# Patient Record
Sex: Female | Born: 1955 | Race: White | Hispanic: No | Marital: Single | Smoking: Current every day smoker
Health system: Southern US, Community
[De-identification: ages and names within clinical notes are randomized; demographics above are authoritative.]

## PROBLEM LIST (undated history)

## (undated) DIAGNOSIS — M549 Dorsalgia, unspecified: Secondary | ICD-10-CM

## (undated) DIAGNOSIS — M1990 Unspecified osteoarthritis, unspecified site: Secondary | ICD-10-CM

## (undated) DIAGNOSIS — I10 Essential (primary) hypertension: Secondary | ICD-10-CM

## (undated) HISTORY — PX: DILATION AND CURETTAGE OF UTERUS: SHX78

## (undated) HISTORY — PX: TONSILLECTOMY: SUR1361

## (undated) HISTORY — PX: APPENDECTOMY: SHX54

---

## 2003-03-13 HISTORY — PX: CARPAL TUNNEL RELEASE: SHX101

## 2003-12-11 ENCOUNTER — Emergency Department (HOSPITAL_COMMUNITY): Admission: EM | Admit: 2003-12-11 | Discharge: 2003-12-12 | Payer: Self-pay | Admitting: Emergency Medicine

## 2004-05-31 ENCOUNTER — Emergency Department: Payer: Self-pay | Admitting: General Practice

## 2004-08-11 ENCOUNTER — Ambulatory Visit: Payer: Self-pay | Admitting: Orthopedic Surgery

## 2005-02-28 ENCOUNTER — Emergency Department: Payer: Self-pay | Admitting: Internal Medicine

## 2005-06-28 ENCOUNTER — Ambulatory Visit: Payer: Self-pay | Admitting: General Surgery

## 2005-09-20 ENCOUNTER — Other Ambulatory Visit: Payer: Self-pay

## 2005-09-20 ENCOUNTER — Inpatient Hospital Stay: Payer: Self-pay | Admitting: Internal Medicine

## 2006-03-12 HISTORY — PX: BACK SURGERY: SHX140

## 2007-02-21 ENCOUNTER — Ambulatory Visit (HOSPITAL_COMMUNITY): Admission: RE | Admit: 2007-02-21 | Discharge: 2007-02-21 | Payer: Self-pay | Admitting: Neurosurgery

## 2007-03-21 ENCOUNTER — Inpatient Hospital Stay (HOSPITAL_COMMUNITY): Admission: RE | Admit: 2007-03-21 | Discharge: 2007-03-23 | Payer: Self-pay | Admitting: Neurosurgery

## 2010-07-25 NOTE — Op Note (Signed)
NAMEKEHINDE, TOTZKE NO.:  [id number]   MEDICAL RECORD NO.:  [id number]          PATIENT TYPE:  INP   LOCATION:  3172                         FACILITY:  [facility]   PHYSICIAN:  Hilda Lias, M.D.   DATE OF BIRTH:  02-08-1956   DATE OF PROCEDURE:  03/21/2007  DATE OF DISCHARGE:                               OPERATIVE REPORT   PREOPERATIVE DIAGNOSIS:  Right L3-L4 herniated disk, intra and  extraforaminal right L4-5 herniated disk, multiple fractures of the  lumbar spine.   POSTOPERATIVE DIAGNOSES:  Right L3-L4 herniated disk, intra and  extraforaminal right L4-5 herniated disk, multiple fractures of the  lumbar spine.   PROCEDURE:  Right L4 hemilaminectomy, right L3-L4 diskectomy, right 4-5  foraminotomy.  Microscope.   SURGEON:  Hilda Lias, M.D.   ASSISTANT:  Hewitt Shorts, M.D.   INDICATIONS FOR PROCEDURE:  The patient was seen by me on several  occasions because of back pain __________  .  The patient has failed  conservative treatment.  X-rays showed that she has a herniated disk at  the level of 3-5, intra and extraforaminal process of 4/5 at the level  of the right side.  Surgery was advised.  The risks were explained to  her in the office.   PROCEDURE:  The patient was taken to the OR and she was positioned in a  prone manner.  The back was cleaned with DuraPrep.  Drapes were applied.   Midline incision from L3 to L4-5 was made.  X-rays showed that indeed  the __________  were at the spinal process of 4 and 5.  Then we brought  the microscope into the area.  We identified 3-4 and 4-5.  The patient  has quite a bit of hypertrophy of the facet.  With the microscope  __________  , we went ahead and we drilled the lower lamina of L4 and  the upper of L5.  Nevertheless, the patient was quite narrow and because  of that we had to proceed with a L4 hemilaminectomy.  The thick yellow  ligament also plus removal of one-third of the facet was done.   We  identified the L4-5 space and although there was a bulging disk, once we  did the foraminotomy, indeed the disk was quite hard and there was no  soft component.  __________  space was made and we decided against doing  the diskectomy.  Then we were at the level of 3-4 and indeed the disk  was soft, bulging, and going slightly into L4.  Incision was made, and a  large amount of degenerative disk with two large fragments lateral were  removed.  At the end, we had good decompression of not only the L4 but  also the L3 nerve root.  Because of that, we decided against doing  extraforaminal approach.  The area was irrigated.  Fentanyl and Depo-  Medrol were left in the epidural space, and the wound was closed with  Vicryl and Steri-Strips.           ______________________________  Hilda Lias, M.D.  EB/MEDQ  D:  03/21/2007  T:  03/21/2007  Job:  [id number]

## 2010-07-25 NOTE — H&P (Signed)
NAMEQUANNA, WITTKE NO.:  [id number]   MEDICAL RECORD NO.:  [id number]          PATIENT TYPE:  INP   LOCATION:  NA                           FACILITY:  [facility]   PHYSICIAN:  Hilda Lias, M.D.   DATE OF BIRTH:  Sep 01, 1955   DATE OF ADMISSION:  03/20/2006  DATE OF DISCHARGE:                              HISTORY & PHYSICAL   Michele Burton is a lady who was seen by me back in July 2008 complaining of  back pain radiation to the right leg, and she was quite miserable.  The  patient told me that the pain was getting worse.  She denies any pain to  the left leg.  She has had had MRI and follow-up.  Because of no  improvement she had a myelogram, and because of that she is being  admitted for surgery.   PAST HISTORY:  Gastrointestinal surgery.  She had a carpal tunnel surgery.   ALLERGIES:  She is allergic to VICODIN.   SOCIAL HISTORY:  She drinks socially.  She smokes a pack a day.   FAMILY HISTORY:  She is adopted.   REVIEW OF SYSTEMS:  Positive for liver disease, back pain, neck pain.   PHYSICAL EXAMINATION:  GENERAL:  The patient came to my office with two  friends.  She has difficulty walking.  HEENT:  Normal.  NECK:  Normal.  LUNGS:  Clear.  HEART:  Heart sounds normal.  ABDOMEN:  Normal.  EXTREMITIES:  Normal pulses.  Marland Kitchen  NEUROLOGICAL:  Showed that she has weakness of the right quadriceps and  iliopsoas.  She had mild weakness of the right dorsiflexion.  The  myelogram showed that indeed she has a herniated disk at the level of 3-  4 compromising the L4 nerve root.  At the level of 4-5 she has intra and  extraforaminal herniated disk.   CLINICAL IMPRESSION:  Right L3-L4, L4-L5 herniated disk.   RECOMMENDATIONS:  The patient is being admitted for surgery.  The  procedure will be a 3-4, 4-5 diskectomy approach in the 4-5  extraforaminal.  She knows about the risks of infection, CSF leak,  worsening pain, paralysis,  need for further surgery.    ______________________________  Hilda Lias, M.D.    EB/MEDQ  D:  03/21/2007  T:  03/21/2007  Job:  [id number]

## 2010-11-29 LAB — CBC
HCT: 41.2
Hemoglobin: 13.9
MCHC: 33.7
MCV: 99.1
Platelets: 167
RBC: 4.16
RDW: 14.7
WBC: 7.2

## 2010-11-29 LAB — HEPATIC FUNCTION PANEL
ALT: 87 — ABNORMAL HIGH
AST: 99 — ABNORMAL HIGH
Albumin: 4.1
Alkaline Phosphatase: 62
Bilirubin, Direct: 0.2
Indirect Bilirubin: 0.3
Total Bilirubin: 0.5
Total Protein: 7.9

## 2011-03-13 HISTORY — PX: EYE SURGERY: SHX253

## 2011-11-13 ENCOUNTER — Ambulatory Visit: Payer: Self-pay | Admitting: Ophthalmology

## 2012-08-27 ENCOUNTER — Other Ambulatory Visit: Payer: Self-pay | Admitting: Neurosurgery

## 2012-08-27 DIAGNOSIS — IMO0002 Reserved for concepts with insufficient information to code with codable children: Secondary | ICD-10-CM

## 2012-09-03 ENCOUNTER — Ambulatory Visit
Admission: RE | Admit: 2012-09-03 | Discharge: 2012-09-03 | Disposition: A | Discharge status: Home / Self Care | Payer: Self-pay | Source: Ambulatory Visit | Attending: Neurosurgery | Admitting: Neurosurgery

## 2012-09-03 ENCOUNTER — Ambulatory Visit
Admission: RE | Admit: 2012-09-03 | Discharge: 2012-09-03 | Disposition: A | Discharge status: Home / Self Care | Payer: No Typology Code available for payment source | Source: Ambulatory Visit | Attending: Neurosurgery | Admitting: Neurosurgery

## 2012-09-03 VITALS — BP 135/83 | HR 75 | Ht 65.0 in | Wt 130.0 lb

## 2012-09-03 DIAGNOSIS — IMO0002 Reserved for concepts with insufficient information to code with codable children: Secondary | ICD-10-CM

## 2012-09-03 MED ORDER — MEPERIDINE HCL 100 MG/ML IJ SOLN
75.0000 mg | Freq: Once | INTRAMUSCULAR | Status: AC
  Administered 2012-09-03: 75 mg via INTRAMUSCULAR

## 2012-09-03 MED ORDER — IOHEXOL 180 MG/ML  SOLN
15.0000 mL | Freq: Once | INTRAMUSCULAR | Status: AC | PRN
  Administered 2012-09-03: 15 mL via INTRATHECAL

## 2012-09-03 MED ORDER — DIAZEPAM 5 MG PO TABS
10.0000 mg | ORAL_TABLET | Freq: Once | ORAL | Status: AC
  Administered 2012-09-03: 10 mg via ORAL

## 2012-09-03 MED ORDER — ONDANSETRON HCL 4 MG/2ML IJ SOLN
4.0000 mg | Freq: Once | INTRAMUSCULAR | Status: AC
  Administered 2012-09-03: 4 mg via INTRAMUSCULAR

## 2012-09-26 ENCOUNTER — Other Ambulatory Visit: Payer: Self-pay | Admitting: Neurosurgery

## 2012-09-29 ENCOUNTER — Encounter (HOSPITAL_COMMUNITY): Payer: Self-pay | Admitting: Pharmacy Technician

## 2012-10-03 ENCOUNTER — Encounter (HOSPITAL_COMMUNITY): Payer: Self-pay

## 2012-10-03 ENCOUNTER — Encounter (HOSPITAL_COMMUNITY)
Admission: RE | Admit: 2012-10-03 | Discharge: 2012-10-03 | Disposition: A | Discharge status: Home / Self Care | Payer: Self-pay | Source: Ambulatory Visit | Attending: Neurosurgery | Admitting: Neurosurgery

## 2012-10-03 DIAGNOSIS — M5126 Other intervertebral disc displacement, lumbar region: Secondary | ICD-10-CM | POA: Insufficient documentation

## 2012-10-03 DIAGNOSIS — Z01812 Encounter for preprocedural laboratory examination: Secondary | ICD-10-CM | POA: Insufficient documentation

## 2012-10-03 HISTORY — DX: Unspecified osteoarthritis, unspecified site: M19.90

## 2012-10-03 LAB — CBC
HCT: 37.5 % (ref 36.0–46.0)
Hemoglobin: 13 g/dL (ref 12.0–15.0)
MCH: 35.3 pg — ABNORMAL HIGH (ref 26.0–34.0)
MCV: 101.9 fL — ABNORMAL HIGH (ref 78.0–100.0)
RBC: 3.68 MIL/uL — ABNORMAL LOW (ref 3.87–5.11)
WBC: 4 10*3/uL (ref 4.0–10.5)

## 2012-10-03 LAB — SURGICAL PCR SCREEN
MRSA, PCR: NEGATIVE
Staphylococcus aureus: NEGATIVE

## 2012-10-03 NOTE — Pre-Procedure Instructions (Addendum)
Sean Macwilliams  10/03/2012   Your procedure is scheduled on:  October 07, 2012  Report to [facility] at 1015 AM.  Call this number if you have problems the morning of surgery: [phone]   Remember:   Do not eat food or drink liquids after midnight.   Take these medicines the morning of surgery with A SIP OF WATER: valium, hydrocodone   Do not wear jewelry, make-up or nail polish.  Do not wear lotions, powders, or perfumes. You may wear deodorant.  Do not shave 48 hours prior to surgery. Men may shave face and neck.  Do not bring valuables to the hospital.  [facility] is not responsible for any belongings or valuables.  Contacts, dentures or bridgework may not be worn into surgery.  Leave suitcase in the car. After surgery it may be brought to your room.  For patients admitted to the hospital, checkout time is 11:00 AM the day of  discharge.   Patients discharged the day of surgery will not be allowed to drive  home.  Name and phone number of your driver:   Special Instructions: Shower using CHG 2 nights before surgery and the night before surgery.  If you shower the day of surgery use CHG.  Use special wash - you have one bottle of CHG for all showers.  You should use approximately 1/3 of the bottle for each shower.   Please read over the following fact sheets that you were given: Pain Booklet, Coughing and Deep Breathing, MRSA Information and Surgical Site Infection Prevention

## 2012-10-06 MED ORDER — CEFAZOLIN SODIUM-DEXTROSE 2-3 GM-% IV SOLR
2.0000 g | INTRAVENOUS | Status: AC
  Administered 2012-10-07: 2 g via INTRAVENOUS
  Filled 2012-10-06: qty 50

## 2012-10-07 ENCOUNTER — Encounter (HOSPITAL_COMMUNITY): Payer: Self-pay | Admitting: Anesthesiology

## 2012-10-07 ENCOUNTER — Ambulatory Visit (HOSPITAL_COMMUNITY): Payer: Self-pay | Admitting: Anesthesiology

## 2012-10-07 ENCOUNTER — Inpatient Hospital Stay (HOSPITAL_COMMUNITY)
Admission: RE | Admit: 2012-10-07 | Discharge: 2012-10-08 | DRG: 491 | Disposition: A | Discharge status: Home / Self Care | Payer: MEDICAID | Source: Ambulatory Visit | Attending: Neurosurgery | Admitting: Neurosurgery

## 2012-10-07 ENCOUNTER — Ambulatory Visit (HOSPITAL_COMMUNITY): Payer: Self-pay

## 2012-10-07 ENCOUNTER — Encounter (HOSPITAL_COMMUNITY): Payer: Self-pay | Admitting: *Deleted

## 2012-10-07 ENCOUNTER — Encounter (HOSPITAL_COMMUNITY)
Admission: RE | Disposition: A | Discharge status: Home / Self Care | Payer: Self-pay | Source: Ambulatory Visit | Attending: Neurosurgery

## 2012-10-07 DIAGNOSIS — M5126 Other intervertebral disc displacement, lumbar region: Principal | ICD-10-CM | POA: Diagnosis present

## 2012-10-07 DIAGNOSIS — F17200 Nicotine dependence, unspecified, uncomplicated: Secondary | ICD-10-CM | POA: Diagnosis present

## 2012-10-07 HISTORY — PX: LUMBAR LAMINECTOMY/DECOMPRESSION MICRODISCECTOMY: SHX5026

## 2012-10-07 SURGERY — LUMBAR LAMINECTOMY/DECOMPRESSION MICRODISCECTOMY 1 LEVEL
Anesthesia: General | Laterality: Right | Wound class: Clean

## 2012-10-07 MED ORDER — ALBUMIN HUMAN 5 % IV SOLN
INTRAVENOUS | Status: DC | PRN
  Administered 2012-10-07: 15:00:00 via INTRAVENOUS

## 2012-10-07 MED ORDER — DIAZEPAM 5 MG PO TABS
5.0000 mg | ORAL_TABLET | Freq: Four times a day (QID) | ORAL | Status: DC | PRN
  Administered 2012-10-07 – 2012-10-08 (×2): 5 mg via ORAL
  Filled 2012-10-07: qty 1

## 2012-10-07 MED ORDER — LIDOCAINE HCL 4 % MT SOLN
OROMUCOSAL | Status: DC | PRN
  Administered 2012-10-07: 4 mL via TOPICAL

## 2012-10-07 MED ORDER — MORPHINE SULFATE (PF) 1 MG/ML IV SOLN
INTRAVENOUS | Status: DC
  Administered 2012-10-07 (×2): via INTRAVENOUS
  Administered 2012-10-07: 10.5 mg via INTRAVENOUS
  Administered 2012-10-08: 13.5 mg via INTRAVENOUS
  Administered 2012-10-08: 6.5 mg via INTRAVENOUS
  Administered 2012-10-08: 09:00:00 via INTRAVENOUS
  Filled 2012-10-07 (×2): qty 25

## 2012-10-07 MED ORDER — CEFAZOLIN SODIUM 1-5 GM-% IV SOLN
1.0000 g | Freq: Three times a day (TID) | INTRAVENOUS | Status: AC
  Administered 2012-10-07 – 2012-10-08 (×2): 1 g via INTRAVENOUS
  Filled 2012-10-07 (×2): qty 50

## 2012-10-07 MED ORDER — ONDANSETRON HCL 4 MG/2ML IJ SOLN
INTRAMUSCULAR | Status: DC | PRN
  Administered 2012-10-07: 4 mg via INTRAVENOUS

## 2012-10-07 MED ORDER — HEMOSTATIC AGENTS (NO CHARGE) OPTIME
TOPICAL | Status: DC | PRN
  Administered 2012-10-07: 1 via TOPICAL

## 2012-10-07 MED ORDER — SODIUM CHLORIDE 0.9 % IV SOLN: INTRAVENOUS | Status: DC

## 2012-10-07 MED ORDER — LIDOCAINE HCL (CARDIAC) 20 MG/ML IV SOLN
INTRAVENOUS | Status: DC | PRN
  Administered 2012-10-07: 100 mg via INTRAVENOUS

## 2012-10-07 MED ORDER — ARTIFICIAL TEARS OP OINT
TOPICAL_OINTMENT | OPHTHALMIC | Status: DC | PRN
  Administered 2012-10-07: 1 via OPHTHALMIC

## 2012-10-07 MED ORDER — SENNOSIDES-DOCUSATE SODIUM 8.6-50 MG PO TABS
1.0000 | ORAL_TABLET | Freq: Every evening | ORAL | Status: DC | PRN
  Filled 2012-10-07: qty 1

## 2012-10-07 MED ORDER — DIAZEPAM 5 MG PO TABS
ORAL_TABLET | ORAL | Status: AC
  Filled 2012-10-07: qty 1

## 2012-10-07 MED ORDER — DIPHENHYDRAMINE HCL 50 MG/ML IJ SOLN: 12.5000 mg | Freq: Four times a day (QID) | INTRAMUSCULAR | Status: DC | PRN

## 2012-10-07 MED ORDER — ZOLPIDEM TARTRATE 5 MG PO TABS: 5.0000 mg | ORAL_TABLET | Freq: Every evening | ORAL | Status: DC | PRN

## 2012-10-07 MED ORDER — LACTATED RINGERS IV SOLN
INTRAVENOUS | Status: DC | PRN
  Administered 2012-10-07 (×3): via INTRAVENOUS

## 2012-10-07 MED ORDER — FENTANYL CITRATE 0.05 MG/ML IJ SOLN
INTRAMUSCULAR | Status: DC | PRN
  Administered 2012-10-07: 100 ug via INTRAVENOUS

## 2012-10-07 MED ORDER — SODIUM CHLORIDE 0.9 % IJ SOLN: 3.0000 mL | INTRAMUSCULAR | Status: DC | PRN

## 2012-10-07 MED ORDER — GLYCOPYRROLATE 0.2 MG/ML IJ SOLN
INTRAMUSCULAR | Status: DC | PRN
  Administered 2012-10-07: .6 mg via INTRAVENOUS

## 2012-10-07 MED ORDER — SODIUM CHLORIDE 0.9 % IJ SOLN
3.0000 mL | Freq: Two times a day (BID) | INTRAMUSCULAR | Status: DC
  Administered 2012-10-07: 3 mL via INTRAVENOUS

## 2012-10-07 MED ORDER — THROMBIN 5000 UNITS EX SOLR
CUTANEOUS | Status: DC | PRN
  Administered 2012-10-07 (×2): 5000 [IU] via TOPICAL

## 2012-10-07 MED ORDER — NALOXONE HCL 0.4 MG/ML IJ SOLN: 0.4000 mg | INTRAMUSCULAR | Status: DC | PRN

## 2012-10-07 MED ORDER — ONDANSETRON HCL 4 MG/2ML IJ SOLN: 4.0000 mg | INTRAMUSCULAR | Status: DC | PRN

## 2012-10-07 MED ORDER — OXYCODONE-ACETAMINOPHEN 5-325 MG PO TABS
ORAL_TABLET | ORAL | Status: AC
  Filled 2012-10-07: qty 2

## 2012-10-07 MED ORDER — BUPIVACAINE-EPINEPHRINE PF 0.5-1:200000 % IJ SOLN
INTRAMUSCULAR | Status: DC | PRN
  Administered 2012-10-07: 9 mL

## 2012-10-07 MED ORDER — ROCURONIUM BROMIDE 100 MG/10ML IV SOLN
INTRAVENOUS | Status: DC | PRN
  Administered 2012-10-07: 10 mg via INTRAVENOUS
  Administered 2012-10-07: 40 mg via INTRAVENOUS

## 2012-10-07 MED ORDER — 0.9 % SODIUM CHLORIDE (POUR BTL) OPTIME
TOPICAL | Status: DC | PRN
  Administered 2012-10-07: 1000 mL

## 2012-10-07 MED ORDER — FENTANYL CITRATE 0.05 MG/ML IJ SOLN
INTRAMUSCULAR | Status: DC | PRN
  Administered 2012-10-07: 25 ug via INTRAVENOUS
  Administered 2012-10-07: 50 ug via INTRAVENOUS
  Administered 2012-10-07: 25 ug via INTRAVENOUS
  Administered 2012-10-07: 50 ug via INTRAVENOUS
  Administered 2012-10-07: 150 ug via INTRAVENOUS
  Administered 2012-10-07: 50 ug via INTRAVENOUS

## 2012-10-07 MED ORDER — SODIUM CHLORIDE 0.9 % IV SOLN: 250.0000 mL | INTRAVENOUS | Status: DC

## 2012-10-07 MED ORDER — MORPHINE SULFATE (PF) 1 MG/ML IV SOLN
INTRAVENOUS | Status: AC
  Filled 2012-10-07: qty 25

## 2012-10-07 MED ORDER — NEOSTIGMINE METHYLSULFATE 1 MG/ML IJ SOLN
INTRAMUSCULAR | Status: DC | PRN
  Administered 2012-10-07: 4 mg via INTRAVENOUS

## 2012-10-07 MED ORDER — FENTANYL CITRATE 0.05 MG/ML IJ SOLN
INTRAMUSCULAR | Status: AC
  Administered 2012-10-07: 50 ug
  Filled 2012-10-07: qty 2

## 2012-10-07 MED ORDER — PHENOL 1.4 % MT LIQD: 1.0000 | OROMUCOSAL | Status: DC | PRN

## 2012-10-07 MED ORDER — LACTATED RINGERS IV SOLN
INTRAVENOUS | Status: DC
  Administered 2012-10-07: 11:00:00 via INTRAVENOUS

## 2012-10-07 MED ORDER — METHYLPREDNISOLONE ACETATE 80 MG/ML IJ SUSP
INTRAMUSCULAR | Status: DC | PRN
  Administered 2012-10-07: 80 mg via INTRA_ARTICULAR

## 2012-10-07 MED ORDER — PROPOFOL 10 MG/ML IV BOLUS
INTRAVENOUS | Status: DC | PRN
  Administered 2012-10-07: 200 mg via INTRAVENOUS

## 2012-10-07 MED ORDER — SODIUM CHLORIDE 0.9 % IJ SOLN: 9.0000 mL | INTRAMUSCULAR | Status: DC | PRN

## 2012-10-07 MED ORDER — ONDANSETRON HCL 4 MG/2ML IJ SOLN: 4.0000 mg | Freq: Four times a day (QID) | INTRAMUSCULAR | Status: DC | PRN

## 2012-10-07 MED ORDER — ACETAMINOPHEN 325 MG PO TABS: 650.0000 mg | ORAL_TABLET | ORAL | Status: DC | PRN

## 2012-10-07 MED ORDER — MIDAZOLAM HCL 5 MG/5ML IJ SOLN
INTRAMUSCULAR | Status: DC | PRN
  Administered 2012-10-07 (×2): 1 mg via INTRAVENOUS

## 2012-10-07 MED ORDER — FENTANYL CITRATE 0.05 MG/ML IJ SOLN
INTRAMUSCULAR | Status: AC
  Filled 2012-10-07: qty 2

## 2012-10-07 MED ORDER — MENTHOL 3 MG MT LOZG: 1.0000 | LOZENGE | OROMUCOSAL | Status: DC | PRN

## 2012-10-07 MED ORDER — DIPHENHYDRAMINE HCL 12.5 MG/5ML PO ELIX
12.5000 mg | ORAL_SOLUTION | Freq: Four times a day (QID) | ORAL | Status: DC | PRN
  Filled 2012-10-07: qty 5

## 2012-10-07 MED ORDER — GABAPENTIN 300 MG PO CAPS
300.0000 mg | ORAL_CAPSULE | Freq: Three times a day (TID) | ORAL | Status: DC
  Administered 2012-10-07 – 2012-10-08 (×3): 300 mg via ORAL
  Filled 2012-10-07 (×5): qty 1

## 2012-10-07 MED ORDER — OXYCODONE-ACETAMINOPHEN 5-325 MG PO TABS
1.0000 | ORAL_TABLET | ORAL | Status: DC | PRN
  Administered 2012-10-07 – 2012-10-08 (×2): 2 via ORAL
  Filled 2012-10-07: qty 2

## 2012-10-07 MED ORDER — ACETAMINOPHEN 650 MG RE SUPP: 650.0000 mg | RECTAL | Status: DC | PRN

## 2012-10-07 SURGICAL SUPPLY — 59 items
APL SKNCLS STERI-STRIP NONHPOA (GAUZE/BANDAGES/DRESSINGS) ×1
BENZOIN TINCTURE PRP APPL 2/3 (GAUZE/BANDAGES/DRESSINGS) ×2 IMPLANT
BLADE SURG ROTATE 9660 (MISCELLANEOUS) IMPLANT
BUR ACORN 6.0 (BURR) ×2 IMPLANT
BUR MATCHSTICK NEURO 3.0 LAGG (BURR) IMPLANT
CANISTER SUCTION 2500CC (MISCELLANEOUS) ×2 IMPLANT
CLOTH BEACON ORANGE TIMEOUT ST (SAFETY) ×2 IMPLANT
CONT SPEC 4OZ CLIKSEAL STRL BL (MISCELLANEOUS) ×2 IMPLANT
DRAPE LAPAROTOMY 100X72X124 (DRAPES) ×2 IMPLANT
DRAPE MICROSCOPE LEICA (MISCELLANEOUS) ×2 IMPLANT
DRAPE POUCH INSTRU U-SHP 10X18 (DRAPES) ×2 IMPLANT
DRSG OPSITE POSTOP 4X6 (GAUZE/BANDAGES/DRESSINGS) ×2 IMPLANT
DRSG PAD ABDOMINAL 8X10 ST (GAUZE/BANDAGES/DRESSINGS) IMPLANT
DURAPREP 26ML APPLICATOR (WOUND CARE) ×2 IMPLANT
ELECT REM PT RETURN 9FT ADLT (ELECTROSURGICAL) ×2
ELECTRODE REM PT RTRN 9FT ADLT (ELECTROSURGICAL) ×1 IMPLANT
GAUZE SPONGE 4X4 16PLY XRAY LF (GAUZE/BANDAGES/DRESSINGS) IMPLANT
GLOVE BIOGEL M 8.0 STRL (GLOVE) ×2 IMPLANT
GLOVE BIOGEL PI IND STRL 8.5 (GLOVE) IMPLANT
GLOVE BIOGEL PI INDICATOR 8.5 (GLOVE) ×1
GLOVE ECLIPSE 7.5 STRL STRAW (GLOVE) ×1 IMPLANT
GLOVE EXAM NITRILE LRG STRL (GLOVE) IMPLANT
GLOVE EXAM NITRILE MD LF STRL (GLOVE) IMPLANT
GLOVE EXAM NITRILE XL STR (GLOVE) IMPLANT
GLOVE EXAM NITRILE XS STR PU (GLOVE) IMPLANT
GLOVE SURG SS PI 8.0 STRL IVOR (GLOVE) ×3 IMPLANT
GOWN BRE IMP SLV AUR LG STRL (GOWN DISPOSABLE) ×1 IMPLANT
GOWN BRE IMP SLV AUR XL STRL (GOWN DISPOSABLE) IMPLANT
GOWN STRL REIN 2XL LVL4 (GOWN DISPOSABLE) IMPLANT
KIT BASIN OR (CUSTOM PROCEDURE TRAY) ×2 IMPLANT
KIT ROOM TURNOVER OR (KITS) ×2 IMPLANT
NDL HYPO 18GX1.5 BLUNT FILL (NEEDLE) IMPLANT
NDL HYPO 21X1.5 SAFETY (NEEDLE) IMPLANT
NDL HYPO 25X1 1.5 SAFETY (NEEDLE) IMPLANT
NDL SPNL 20GX3.5 QUINCKE YW (NEEDLE) IMPLANT
NEEDLE HYPO 18GX1.5 BLUNT FILL (NEEDLE) ×2 IMPLANT
NEEDLE HYPO 21X1.5 SAFETY (NEEDLE) ×2 IMPLANT
NEEDLE HYPO 25X1 1.5 SAFETY (NEEDLE) ×4 IMPLANT
NEEDLE SPNL 20GX3.5 QUINCKE YW (NEEDLE) IMPLANT
NS IRRIG 1000ML POUR BTL (IV SOLUTION) ×2 IMPLANT
PACK LAMINECTOMY NEURO (CUSTOM PROCEDURE TRAY) ×2 IMPLANT
PAD ARMBOARD 7.5X6 YLW CONV (MISCELLANEOUS) ×6 IMPLANT
PATTIES SURGICAL .5 X1 (DISPOSABLE) ×1 IMPLANT
RUBBERBAND STERILE (MISCELLANEOUS) ×4 IMPLANT
SPONGE GAUZE 4X4 12PLY (GAUZE/BANDAGES/DRESSINGS) ×2 IMPLANT
SPONGE LAP 4X18 X RAY DECT (DISPOSABLE) IMPLANT
SPONGE SURGIFOAM ABS GEL SZ50 (HEMOSTASIS) ×2 IMPLANT
STRIP CLOSURE SKIN 1/2X4 (GAUZE/BANDAGES/DRESSINGS) ×2 IMPLANT
SUT ETHILON 3 0 FSL (SUTURE) ×1 IMPLANT
SUT VIC AB 0 CT1 18XCR BRD8 (SUTURE) ×1 IMPLANT
SUT VIC AB 0 CT1 8-18 (SUTURE) ×2
SUT VIC AB 2-0 CP2 18 (SUTURE) ×2 IMPLANT
SUT VIC AB 3-0 SH 8-18 (SUTURE) ×2 IMPLANT
SYR 20CC LL (SYRINGE) IMPLANT
SYR 20ML ECCENTRIC (SYRINGE) ×2 IMPLANT
SYR 5ML LL (SYRINGE) ×1 IMPLANT
TOWEL OR 17X24 6PK STRL BLUE (TOWEL DISPOSABLE) ×2 IMPLANT
TOWEL OR 17X26 10 PK STRL BLUE (TOWEL DISPOSABLE) ×2 IMPLANT
WATER STERILE IRR 1000ML POUR (IV SOLUTION) ×2 IMPLANT

## 2012-10-07 NOTE — H&P (Signed)
Michele Burton is an 57 y.o. female.   Chief Complaint: right leg pain HPI: patient who came to my office with the complain of lbp with radiation to the right leg after he was moving a couch. She has had conservative treatment with no relief of the pain. Mri of the lumbar spine showed a large extraforaminal hnp with compromisr of the right l4 nerve root.   Past Medical History  Diagnosis Date  . Arthritis     Past Surgical History  Procedure Laterality Date  . Back surgery  08  . Carpal tunnel release Bilateral 05  . Eye surgery Right 13    cat  . Tonsillectomy    . Dilation and curettage of uterus    . Appendectomy      spontantous abortion,hairy cyct    No family history on file. Social History:  reports that she has been smoking Cigarettes.  She has a 16 pack-year smoking history. She has never used smokeless tobacco. She reports that she drinks about 1.2 ounces of alcohol per week. She reports that she does not use illicit drugs.  Allergies: No Known Allergies  No prescriptions prior to admission    No results found for this or any previous visit (from the past 48 hour(s)). No results found.  Review of Systems  HENT: Positive for neck pain.   Eyes: Negative.   Cardiovascular: Negative.   Gastrointestinal: Negative.   Musculoskeletal: Positive for back pain.  Skin: Negative.   Neurological: Positive for sensory change and focal weakness.       PREVIOUS SURGERY IN LUMBAR AREA  Endo/Heme/Allergies: Negative.   Psychiatric/Behavioral: Positive for depression.    There were no vitals taken for this visit. Physical Exam hent, nl. Neck, nl. Cv, nl. Lungs, clear. Abdomen, soft. Extremities, nl. NEURO DECREASE OF ANKLE REFLEX. SENSORY , NUMBNESS RIGHT L5 S1 DERMATOME.  X-RAYS SHOWS SCOLIOSIS TO THE RIGHT WITH THE APEX AT L23, 34. . Mri extraforaminal hnp affecting the right l4 nerve root. Assessment/Plan Patient to proceed with right l4-5 extraforaminal discectomy. She is  aware of risks and benefits  Michele Burton M 10/07/2012, 9:27 AM

## 2012-10-07 NOTE — Progress Notes (Signed)
Doing well.no weakness. Spoke with friend

## 2012-10-07 NOTE — Preoperative (Signed)
Beta Blockers   Reason not to administer Beta Blockers:Not Applicable 

## 2012-10-07 NOTE — Progress Notes (Signed)
Op note [phone]

## 2012-10-07 NOTE — Anesthesia Preprocedure Evaluation (Addendum)
Anesthesia Evaluation  Patient identified by MRN, date of birth, ID band Patient awake    Airway       Dental   Pulmonary Current Smoker,          Cardiovascular     Neuro/Psych    GI/Hepatic   Endo/Other    Renal/GU      Musculoskeletal   Abdominal   Peds  Hematology   Anesthesia Other Findings   Reproductive/Obstetrics                           Anesthesia Physical Anesthesia Plan  ASA: II  Anesthesia Plan: General   Post-op Pain Management:    Induction: Intravenous  Airway Management Planned: Oral ETT  Additional Equipment:   Intra-op Plan:   Post-operative Plan: Extubation in OR  Informed Consent: I have reviewed the patients History and Physical, chart, labs and discussed the procedure including the risks, benefits and alternatives for the proposed anesthesia with the patient or authorized representative who has indicated his/her understanding and acceptance.   Dental advisory given  Plan Discussed with: CRNA and Anesthesiologist  Anesthesia Plan Comments:         Anesthesia Quick Evaluation

## 2012-10-07 NOTE — Transfer of Care (Signed)
Immediate Anesthesia Transfer of Care Note  Patient: Michele Burton  Procedure(s) Performed: Procedure(s) with comments: Right Lumbar Four-Five Extraforaminal microdiskectomy with C-arm (Right) - Right Lumbar Four-Five Extraforaminal microdiskectomy with C-arm  Patient Location: PACU  Anesthesia Type:General  Level of Consciousness: awake, alert  and oriented  Airway & Oxygen Therapy: Patient Spontanous Breathing and Patient connected to nasal cannula oxygen  Post-op Assessment: Report given to PACU RN and Post -op Vital signs reviewed and stable  Post vital signs: Reviewed and stable  Complications: No apparent anesthesia complications

## 2012-10-07 NOTE — Anesthesia Procedure Notes (Signed)
Procedure Name: Intubation Date/Time: 10/07/2012 1:11 PM Performed by: Sherie Don Pre-anesthesia Checklist: Patient identified, Emergency Drugs available, Suction available, Patient being monitored and Timeout performed Patient Re-evaluated:Patient Re-evaluated prior to inductionOxygen Delivery Method: Circle system utilized Preoxygenation: Pre-oxygenation with 100% oxygen Intubation Type: IV induction Ventilation: Mask ventilation without difficulty Laryngoscope Size: Miller and 2 (DL X 2 pinch to upper lip) Grade View: Grade II Tube type: Oral Tube size: 7.5 mm Number of attempts: 1 Airway Equipment and Method: Stylet and LTA kit utilized Placement Confirmation: ETT inserted through vocal cords under direct vision,  positive ETCO2 and breath sounds checked- equal and bilateral Secured at: 22 cm Tube secured with: Tape Dental Injury: Teeth and Oropharynx as per pre-operative assessment and Injury to lip

## 2012-10-07 NOTE — Anesthesia Postprocedure Evaluation (Signed)
  Anesthesia Post-op Note  Patient: Michele Burton  Procedure(s) Performed: Procedure(s) with comments: Right Lumbar Four-Five Extraforaminal microdiskectomy with C-arm (Right) - Right Lumbar Four-Five Extraforaminal microdiskectomy with C-arm  Patient Location: PACU  Anesthesia Type:General  Level of Consciousness: awake  Airway and Oxygen Therapy: Patient Spontanous Breathing  Post-op Pain: mild  Post-op Assessment: Post-op Vital signs reviewed  Post-op Vital Signs: Reviewed  Complications: No apparent anesthesia complications

## 2012-10-08 NOTE — Op Note (Signed)
NAMELADANA, Michele Burton NO.:  [id number]  MEDICAL RECORD NO.:  [id number]  LOCATION:  3535                         FACILITY:  [facility]  PHYSICIAN:  Hilda Lias, M.D.   DATE OF BIRTH:  Sep 17, 1955  DATE OF PROCEDURE:  10/07/2012 DATE OF DISCHARGE:                              OPERATIVE REPORT   PREOPERATIVE DIAGNOSIS:  Right L4-L5 extraforaminal herniated disk with L4 radiculopathy.  Status post lumbar laminectomy.  POSTOPERATIVE DIAGNOSES:  Right L4-L5 extraforaminal herniated disk with L4 radiculopathy. Status post lumbar laminectomy.  PROCEDURE:  Right L4-L5 extraforaminal diskectomy, lysis of adhesion, decompression of the L4 nerve root.  Microscope.  SURGEON:  Hilda Lias, M.D.  ASSISTANT:  Clydene Fake, M.D.  CLINICAL HISTORY:  The patient in the past had lumbar laminectomy. Lately, she had been complaining of back pain radiating to the right leg associated with numbness and burning sensation, which is no better with conservative treatment.  Myelogram showed that she has decompression posterior in the lumbar area with a herniated disk at level of L4-5 affecting the L4 nerve root.  In view of no improvement, the patient agreed with surgery.  The patient knew the risk of the surgery such as no improvement whatsoever, worsening of the pain, CSF leak, infection, hematoma.  PROCEDURE:  The patient was taken to the OR, and after intubation, she was positioned in a prone manner.  The back was cleaned with DuraPrep. With the C-arm, we were able to localize the area between 4-5 in the right side at the level of the intertransverse process.  Then, drapes were applied.  Incision, paramedian was done following the previous mark, through the skin, subcutaneous tissue, muscle straight down to the intertransverse ligament.  We brought the microscope into the area.  We opened the ligament in a longitudinal away.  Immediately, we found that the L4 nerve root was  quite thick with quite a bit of adhesion with scar tissue surrounded.  Lysis was accomplished.  We were able to mobilize the L4 nerve root and we were straight to the disk.  Indeed, there was quite a bit of adhesion between the disk and the nerve root.  Lysis was accomplished.  We removed the scar tissue and indeed, there were some degenerative disk disease right at the takeoff of the L4 nerve root.  We entered the disk space, partial diskectomy with decompression of the L4 nerve root was achieved. At the end, we had plenty of space.  Then, Valsalva maneuver was negative.  Fentanyl and Depo-Medrol were left in the level of the L4 nerve root and the wound was closed with Vicryl and nylon.          ______________________________ Hilda Lias, M.D.     EB/MEDQ  D:  10/07/2012  T:  10/08/2012  Job:  [id number]

## 2012-10-08 NOTE — Evaluation (Signed)
Physical Therapy Evaluation Patient Details Name: Michele Burton MRN: [id number] DOB: [birth year] Today's Date: 10/08/2012 Time: 0855-0906 PT Time Calculation (min): 11 min  PT Assessment / Plan / Recommendation History of Present Illness     Clinical Impression  Patient is s/p Right L4-L5 extraforaminal diskectomy, lysis of adhesion,  decompression of the L4 nerve root surgery resulting in functional limitations due to the deficits listed below (see PT Problem List).  Patient will benefit from skilled PT to increase their independence and safety with mobility to allow discharge home with 24/7 assist available for a week per pt.  Pt also reports her brother will assist her home.  Verbally educated on safe stair technique in case pt d/c prior to next session (not performed due to pt reporting dizziness with ambulation).     PT Assessment  Patient needs continued PT services    Follow Up Recommendations  No PT follow up    Does the patient have the potential to tolerate intense rehabilitation      Barriers to Discharge        Equipment Recommendations  None recommended by PT    Recommendations for Other Services     Frequency Min 5X/week    Precautions / Restrictions Precautions Precautions: Back Precaution Comments: educated on back precautions Restrictions Weight Bearing Restrictions: No   Pertinent Vitals/Pain SaO2 dropped to 86% upon return to room after ambulation on room air so reapplied O2 [place] (pt on PCA)      Mobility  Bed Mobility Bed Mobility: Left Sidelying to Sit;Sit to Sidelying Left;Rolling Left Rolling Left: 5: Supervision;With rail Left Sidelying to Sit: 5: Supervision;With rails Sit to Sidelying Left: 5: Supervision Details for Bed Mobility Assistance: verbal cues for log roll technique Transfers Transfers: Sit to Stand;Stand to Sit Sit to Stand: 4: Min guard;From bed;With upper extremity assist Stand to Sit: 4: Min guard;To bed;With upper extremity  assist Details for Transfer Assistance: verbal cues for safe technique Ambulation/Gait Ambulation/Gait Assistance: 4: Min guard Ambulation Distance (Feet): 160 Feet Assistive device: Other (Comment) Ambulation/Gait Assistance Details: pt used IV pole for support, SaO2 dropped to 86% room air upon returning to room so reapplied O2 [place] (on PCA), pt reported dizziness halfway through ambulation so returned to room Gait Pattern: Step-through pattern;Decreased stride length Gait velocity: decreased General Gait Details: increased toe in and/or internal rotation with increased knee flexion during gait    Exercises     PT Diagnosis: Difficulty walking;Acute pain  PT Problem List: Decreased mobility;Decreased activity tolerance;Decreased knowledge of use of DME;Pain PT Treatment Interventions: Gait training;DME instruction;Functional mobility training;Stair training;Patient/family education     PT Goals(Current goals can be found in the care plan section) Acute Rehab PT Goals PT Goal Formulation: With patient Time For Goal Achievement: 10/15/12 Potential to Achieve Goals: Good  Visit Information  Last PT Received On: 10/08/12 Assistance Needed: +1       Prior Functioning  Home Living Family/patient expects to be discharged to:: Private residence Living Arrangements: Spouse/significant other Available Help at Discharge: Available 24 hours/day;Family Type of Home: House Home Access: Stairs to enter Secretary/administrator of Steps: 4 Entrance Stairs-Rails: None Home Layout: One level Additional Comments: pt reports she will be borrowing RW, toilet riser Prior Function Level of Independence: Independent Communication Communication: No difficulties    Cognition  Cognition Arousal/Alertness: Awake/alert Behavior During Therapy: WFL for tasks assessed/performed Overall Cognitive Status: Within Functional Limits for tasks assessed    Extremity/Trunk Assessment Lower Extremity  Assessment Lower Extremity Assessment:  RLE deficits/detail RLE Deficits / Details: decreased active hip flexion due to pain   Balance    End of Session PT - End of Session Activity Tolerance: Patient limited by pain;Patient limited by fatigue Patient left: in bed;with call bell/phone within reach  GP     [facility] E 10/08/2012, 10:06 AM Zenovia Jarred, PT, DPT 10/08/2012 Pager: [phone]

## 2012-10-08 NOTE — Progress Notes (Signed)
Pt given D/C instructions with Rx's, verbal understanding of teaching given. Pt D/C'd home via wheelchair with family per MD order. Rema Fendt, RN

## 2012-10-08 NOTE — Discharge Summary (Signed)
Physician Discharge Summary  Patient ID: Michele Burton MRN: [id number] DOB/AGE: 08-14-1955 57 y.o.  Admit date: 10/07/2012 Discharge date: 10/08/2012  Admission Diagnoses:right l45 hnp  Discharge Diagnoses: same Active Problems:   * No active hospital problems. *   Discharged Condition: no pain  Hospital Course: surgery  Consults none  Significant Diagnostic Studies: myelogram   Treatments: discectomy  Discharge Exam: Blood pressure 139/84, pulse 57, temperature 98.6 F (37 C), temperature source Oral, resp. rate 16, SpO2 92.00%. No pain, no weakness.  Disposition:  home    Medication List    ASK your doctor about these medications       diazepam 5 MG tablet  Commonly known as:  VALIUM  Take 5 mg by mouth every 6 (six) hours as needed. For spasms     HYDROcodone-acetaminophen 10-325 MG per tablet  Commonly known as:  NORCO  Take 1 tablet by mouth every 6 (six) hours as needed for pain.     multivitamin with minerals tablet  Take 1 tablet by mouth daily.     OVER THE COUNTER MEDICATION  Take 2 tablets by mouth daily. Hair, Skin and Nails Gummies         Signed: Michail Boyte M 10/08/2012, 1:36 PM

## 2012-10-08 NOTE — Evaluation (Signed)
Occupational Therapy Evaluation Patient Details Name: Michele Burton MRN: [id number] DOB: 1955-05-28 Today's Date: 10/08/2012 Time: 1191-4782 OT Time Calculation (min): 14 min  OT Assessment / Plan / Recommendation History of present illness  s/p Right L4-5 extraforaminal diskectomy   Clinical Impression   Pt s/p Right L4-5 extraforaminal diskectomy. Pt will benefit from continued acute OT services to address below problem list in prep for return home with family.    OT Assessment  Patient needs continued OT Services    Follow Up Recommendations  No OT follow up;Supervision/Assistance - 24 hour    Barriers to Discharge      Equipment Recommendations  None recommended by OT    Recommendations for Other Services    Frequency  Min 2X/week    Precautions / Restrictions Precautions Precautions: Back Precaution Comments: Educated pt on 3/3 back precautions. Restrictions Weight Bearing Restrictions: No   Pertinent Vitals/Pain See vitals    ADL  Eating/Feeding: Performed;Independent Where Assessed - Eating/Feeding: Edge of bed Grooming: Performed;Wash/dry face;Set up Where Assessed - Grooming: Unsupported sitting Upper Body Bathing: Simulated;Set up Where Assessed - Upper Body Bathing: Unsupported sitting Lower Body Bathing: Simulated;Supervision/safety Where Assessed - Lower Body Bathing: Unsupported sitting Upper Body Dressing: Simulated;Set up Where Assessed - Upper Body Dressing: Unsupported sitting Lower Body Dressing: Performed;Minimal assistance Where Assessed - Lower Body Dressing: Unsupported sitting Transfers/Ambulation Related to ADLs: pt declining OOB transfer due to pain/fatigue. ADL Comments: Pt able to cross legs in order to perform LB ADL tasks while avoiding bending. However, pt with some difficulty bringing Right ankle over left knee due to pain.  Able to bend R knee enough though to pull sock up over heel.  Pt reports she has family to assist with ADLs 24/7.     OT Diagnosis: Generalized weakness;Acute pain  OT Problem List: Decreased strength;Decreased activity tolerance;Decreased knowledge of precautions;Decreased knowledge of use of DME or AE;Pain OT Treatment Interventions: Self-care/ADL training;DME and/or AE instruction;Therapeutic activities;Patient/family education   OT Goals(Current goals can be found in the care plan section) Acute Rehab OT Goals Patient Stated Goal: to return home OT Goal Formulation: With patient Time For Goal Achievement: 10/15/12 Potential to Achieve Goals: Good  Visit Information  Last OT Received On: 10/08/12 Assistance Needed: +1       Prior Functioning     Home Living Family/patient expects to be discharged to:: Private residence Living Arrangements: Spouse/significant other Available Help at Discharge: Available 24 hours/day;Family Type of Home: House Home Access: Stairs to enter Secretary/administrator of Steps: 4 Entrance Stairs-Rails: None Home Layout: One level Home Equipment: Shower seat;Bedside commode Additional Comments: pt reports she will be borrowing RW, toilet riser Prior Function Level of Independence: Independent Communication Communication: No difficulties         Vision/Perception     Cognition  Cognition Arousal/Alertness: Lethargic;Suspect due to medications Behavior During Therapy: WFL for tasks assessed/performed Overall Cognitive Status: Within Functional Limits for tasks assessed    Extremity/Trunk Assessment Upper Extremity Assessment Upper Extremity Assessment: Overall WFL for tasks assessed Lower Extremity Assessment Lower Extremity Assessment: RLE deficits/detail RLE Deficits / Details: decreased active hip flexion due to pain     Mobility Bed Mobility Bed Mobility: Left Sidelying to Sit;Rolling Left;Sitting - Scoot to Edge of Bed;Sit to Sidelying Left Rolling Left: 5: Supervision Left Sidelying to Sit: 5: Supervision Sitting - Scoot to Edge of Bed:  5: Supervision Sit to Sidelying Left: 5: Supervision Details for Bed Mobility Assistance: VCs for log roll technique. Transfers Sit to  Stand: 4: Min guard;From bed;With upper extremity assist Stand to Sit: 4: Min guard;To bed;With upper extremity assist Details for Transfer Assistance: verbal cues for safe technique     Exercise     Balance     End of Session OT - End of Session Activity Tolerance: Patient limited by fatigue Patient left: in bed;with call bell/phone within reach Nurse Communication: Mobility status  GO   10/08/2012 Cipriano Mile OTR/L Pager [phone] Office [phone]   Cipriano Mile 10/08/2012, 12:11 PM

## 2012-10-10 ENCOUNTER — Encounter (HOSPITAL_COMMUNITY): Payer: Self-pay | Admitting: Neurosurgery

## 2013-09-20 IMAGING — CT CT L SPINE W/ CM
4 of 10 series · 11 of 33 positions shown, 13 images · IV contrast (omnipaque)
Comparison: CT myelogram 02/21/2007.

MYELOGRAM INJECTION
TECHNIQUE: Informed consent was obtained from the patient prior to
the procedure, including potential complications of headache,
allergy, infection and pain. Specific instructions were given
regarding 24 hour bedrest post procedure to prevent post-LP
headache.  A timeout procedure was performed.  With the patient
prone, the lower back was prepped with Betadine.  1% Lidocaine was
used for local anesthesia.  Lumbar puncture was performed by the
radiologist at the L4 level using a 22 gauge needle with return of
clear CSF.  15 cc of Omnipaque 180 was injected into the
subarachnoid space .
CLINICAL DATA: Low back pain.  Right leg pain.  Right S1
dermatotome numbness.  Prior lumbar decompression.
TECHNIQUE: Multidetector CT imaging of the lumbar spine was
performed following myelography.  Multiplanar CT image
reconstructions were also generated.

[Series 2: l spine bone · axial · 0.27mm/px · z∈[-186,-106]mm · 2 of 98 slices shown, 3 images]
[im 33/98  soft-tissue]
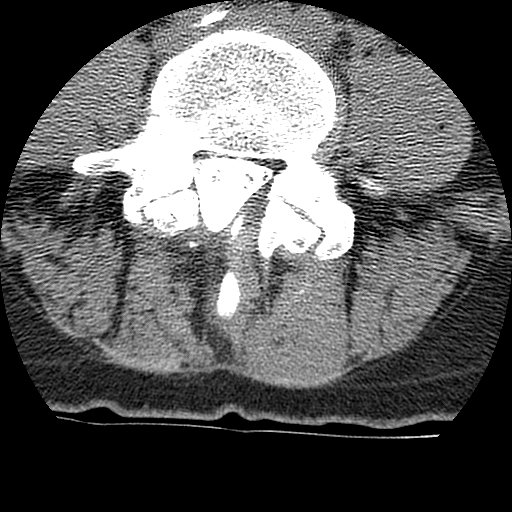
[im 33/98  bone]
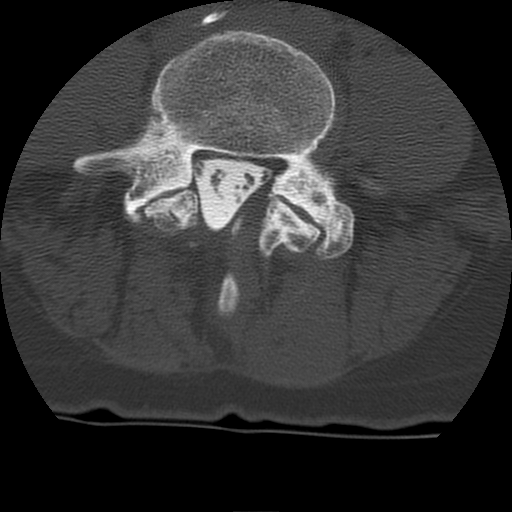
[im 65/98  bone]
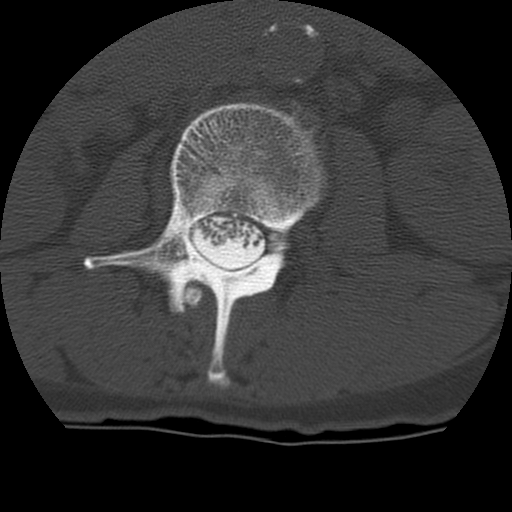

[Series 3: l spine soft · axial · 0.27mm/px · z∈[-206,-86]mm · 3 of 98 slices shown]
[im 25/98  soft-tissue]
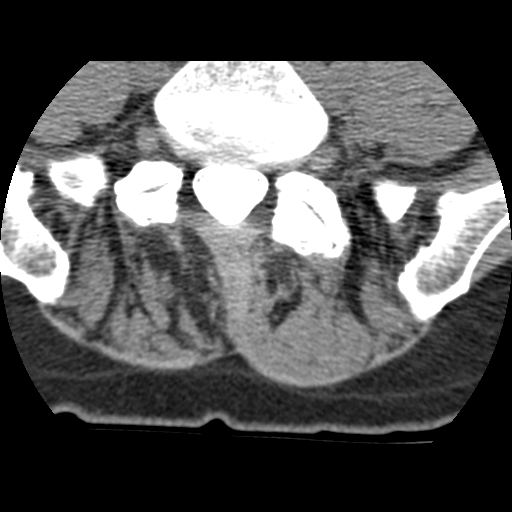
[im 49/98  soft-tissue]
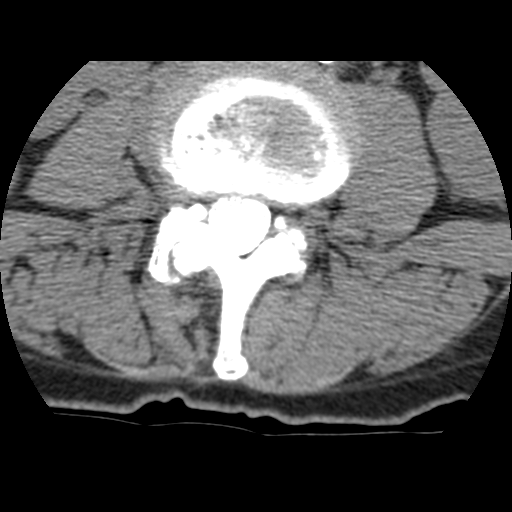
[im 73/98  soft-tissue]
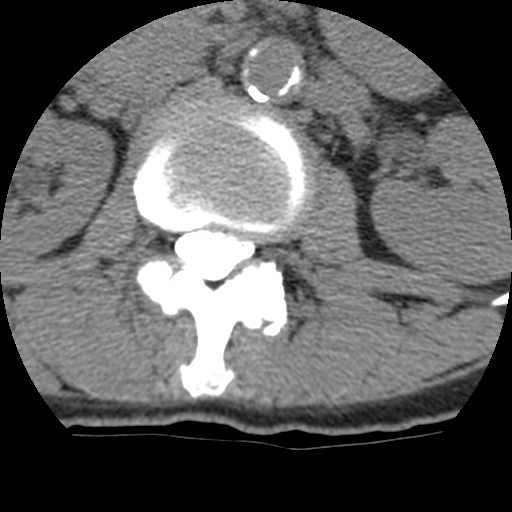

[Series 105: cor/lower · coronal · 0.27mm/px · 1 of 46 slices shown]
[im 23/46  bone]
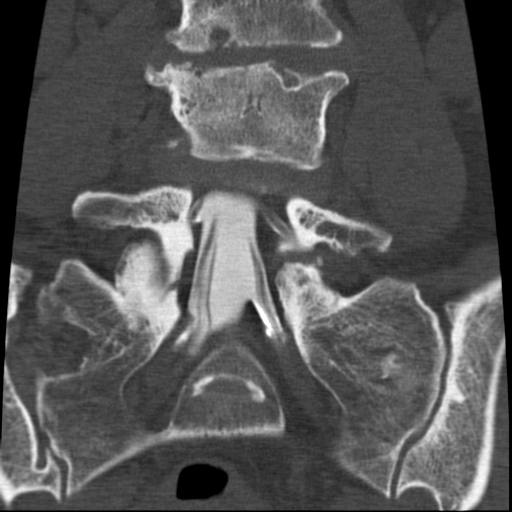

[Series 106: sag · sagittal · 0.49mm/px · 5 of 53 slices shown, 6 images]
[im 18/53  bone]
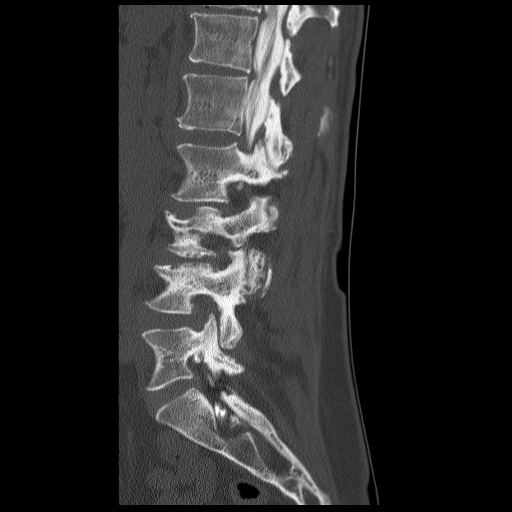
[im 22/53  bone]
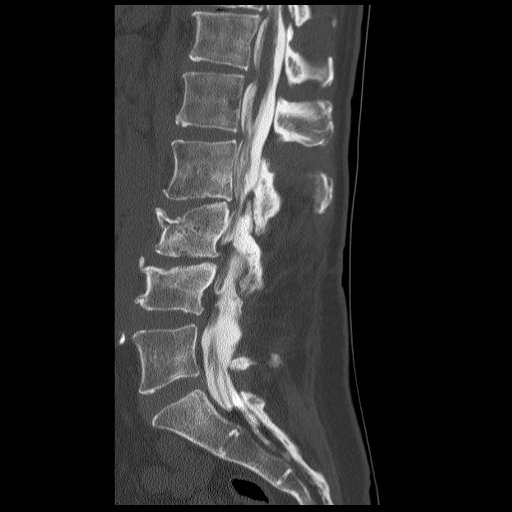
[im 27/53  soft-tissue]
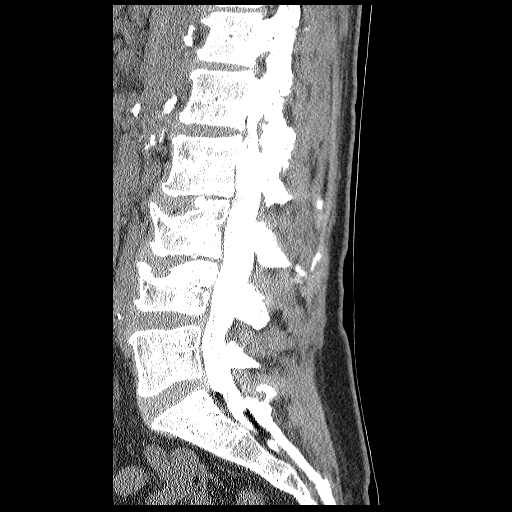
[im 27/53  bone]
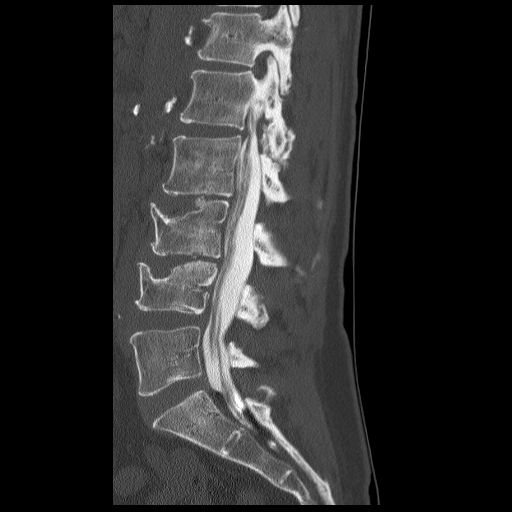
[im 31/53  bone]
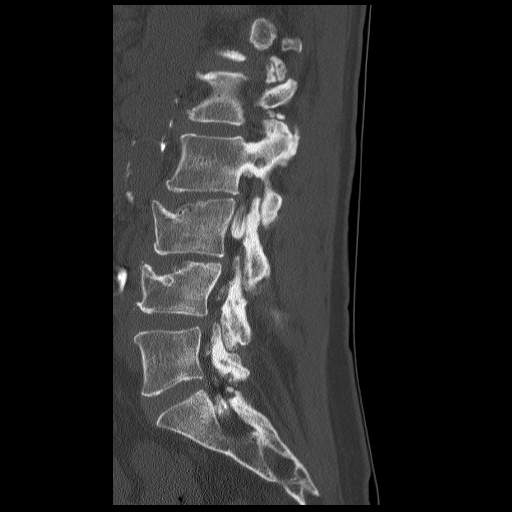
[im 35/53  bone]
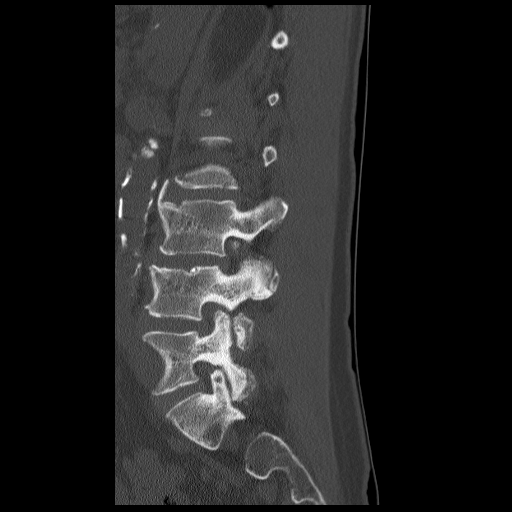

[11 of 33 positions shown; findings below may reference images not displayed]

IMPRESSION: Successful injection of  intrathecal contrast for myelography.

MYELOGRAM LUMBAR
FINDINGS: Mild degenerative scoliosis convex left mid lumbar region
related to asymmetric loss of interspace height at L2-3 and L3-4.
Remote superior endplate compression deformities at L3-L4 without
retropulsion.  Shallow ventral defect L2-3.  Slight lateral recess
stenosis L2-3 on the right.  Minimal retrolisthesis L4 on L5 and L3
on L4 without dynamic instability.  Moderate vascular
calcification.

Fluoroscopy Time: 1.00 minutes
IMPRESSION: As above.

CT MYELOGRAPHY LUMBAR SPINE
FINDINGS: No prevertebral or paraspinous masses.  Moderately
advanced vascular calcification of the aortoiliac system.

T12-L1:  Central and leftward disc extrusion.  Moderate facet
arthropathy.  Left L1 nerve root impingement is possible.

L1-2: Shallow central protrusion.  Mild facet and ligamentum flavum
hypertrophy.  No impingement.

L2-3: Central and rightward protrusion with asymmetric loss of
interspace height, also on the right.  Right-sided extraforaminal
spurring and foraminal narrowing.  Right L2 and right L3 nerve root
impingement unlikely.

L3-4: Advanced right greater than left disc space narrowing,
subchondral cyst formation, and osteophytic spurring.  Right
hemilaminotomy.  Bilateral right greater than left facet
arthropathy.  Right L3 nerve root impingement is likely in the
foramen.  There is no L4 nerve root impingement in the canal.

L4-5: Right hemilaminotomy.  Good preservation of disc height.  A
large extraforaminal protrusion on the right is noted.  Right L4
nerve root impingement is likely.  There is no L5 nerve root
compression in the canal.

L5-S1: Mild facet arthropathy.  Mild annular bulging.  No
impingement.

Compared with 02/21/07  the appearance at L3-4 and L4-5 is
improved.  The extraforaminal protrusion on the right L4-5 is
new/worse.
IMPRESSION: The dominant right-sided abnormality is at L4-L5 where a large
extraforaminal protrusion likely affects the right L4 nerve root.
Improved appearance status post right L3-4 and right L4-5
discectomies.

Remote compression deformities L3 and L4.

Asymmetric neural foraminal narrowing L2-3 on the right and L3-4 on
the right; correlate clinically for upper lumbar radiculopathy.

Central and leftward extrusion T12-L1.

## 2013-10-24 IMAGING — RF DG C-ARM 61-120 MIN
1 series · 1 of 1 positions shown · non-contrast
Comparison: Lumbar myelogram CT 09/03/2012.

CLINICAL DATA: Right L4-L5 extraforaminal microdiskectomy.

DG C-ARM 1-60 MIN,LUMBAR SPINE - 1 VIEW
TECHNIQUE: C-arm fluoroscopic images were obtained
intraoperatively and submitted for postoperative interpretation.
Please see the performing provider's procedural report for the
fluoroscopy time utilized.

[Series 1: run · 1 of 1 slices shown]
[im 1/1]
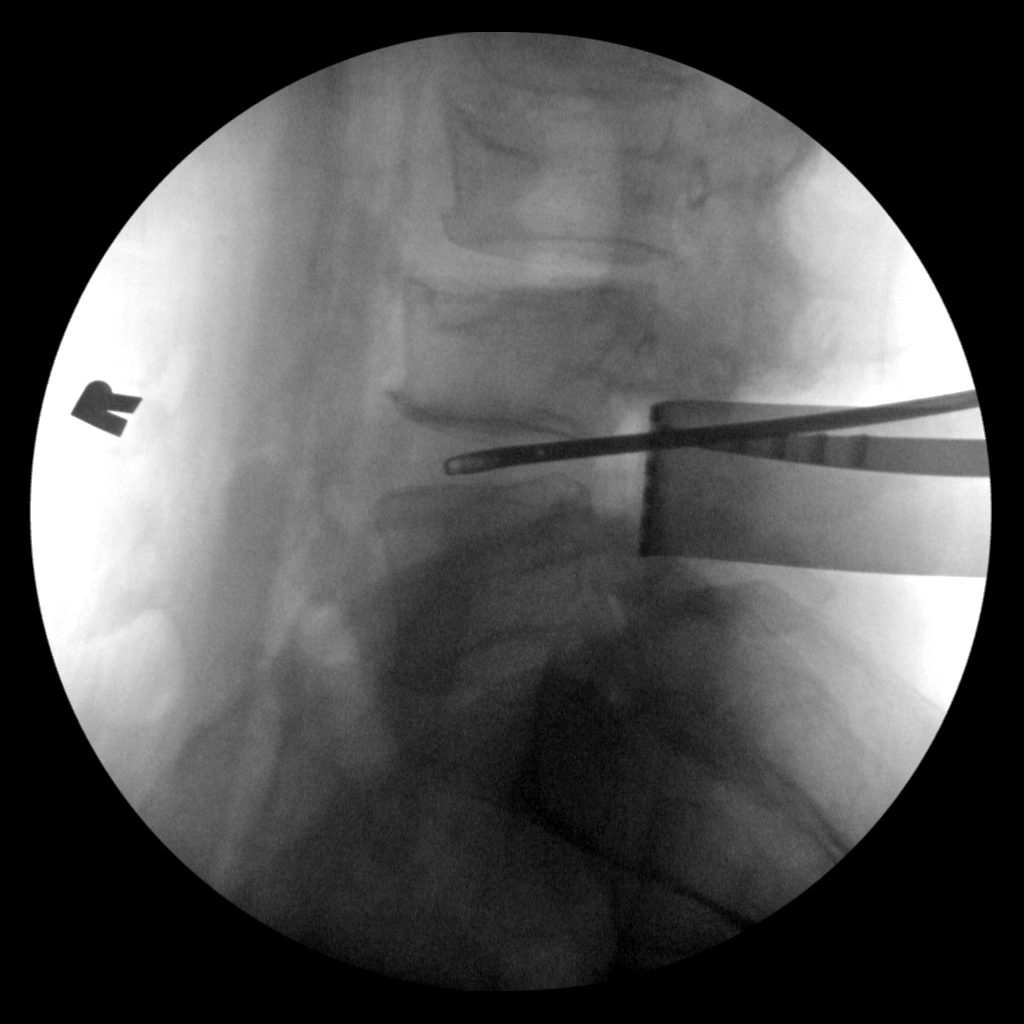

[1 of 1 positions shown; findings below may reference images not displayed]

FINDINGS: A single cross-table lateral fluoroscopic image of the
lower lumbar spine demonstrates skin spreaders posteriorly at L4-
L5.  A blunt surgical instrument overlies the L4-L5 disc space.
Superior endplate compression deformities at L3 and L4 are grossly
unchanged.
IMPRESSION: Intraoperative localization as described.

## 2014-02-05 ENCOUNTER — Emergency Department: Payer: Self-pay | Admitting: Emergency Medicine

## 2014-02-05 LAB — CBC
HCT: 40.2 % (ref 35.0–47.0)
HGB: 13.6 g/dL (ref 12.0–16.0)
MCH: 36.6 pg — ABNORMAL HIGH (ref 26.0–34.0)
MCHC: 33.8 g/dL (ref 32.0–36.0)
MCV: 108 fL — ABNORMAL HIGH (ref 80–100)
Platelet: 75 10*3/uL — ABNORMAL LOW (ref 150–440)
RBC: 3.71 10*6/uL — ABNORMAL LOW (ref 3.80–5.20)
RDW: 15.2 % — AB (ref 11.5–14.5)
WBC: 6 10*3/uL (ref 3.6–11.0)

## 2014-02-05 LAB — BASIC METABOLIC PANEL
Anion Gap: 6 — ABNORMAL LOW (ref 7–16)
BUN: 12 mg/dL (ref 7–18)
CHLORIDE: 110 mmol/L — AB (ref 98–107)
Calcium, Total: 8.9 mg/dL (ref 8.5–10.1)
Co2: 25 mmol/L (ref 21–32)
Creatinine: 0.95 mg/dL (ref 0.60–1.30)
EGFR (African American): >60
EGFR (Non-African Amer.): >60
Glucose: 111 mg/dL — ABNORMAL HIGH (ref 65–99)
OSMOLALITY: 282 (ref 275–301)
POTASSIUM: 4.1 mmol/L (ref 3.5–5.1)
SODIUM: 141 mmol/L (ref 136–145)

## 2014-02-05 LAB — TROPONIN I

## 2014-06-29 NOTE — Op Note (Signed)
PATIENT NAME:  Michele Burton, [name] MR#:  [id number] DATE OF BIRTH:  1955/09/07  DATE OF PROCEDURE:  11/13/2011  PREOPERATIVE DIAGNOSIS: Visually significant cataract of the right eye.   POSTOPERATIVE DIAGNOSIS: Visually significant cataract of the right eye.   OPERATIVE PROCEDURE: Cataract extraction by phacoemulsification with implant of intraocular lens to the right eye.   SURGEON: Galen ManilaWilliam Hawk Mones, MD  ANESTHESIA:  1. Managed anesthesia care.  2. 50-50 mixture of 0.75% bupivacaine and 4% Xylocaine given as a retrobulbar block.   COMPLICATIONS: None.   TECHNIQUE: stop and chop  DESCRIPTION OF PROCEDURE: The patient was examined and consented for this procedure in the preoperative holding area and then brought back to the Operating Room where the anesthesia team employed managed anesthesia care.  3.5 milliliters of the aforementioned mixture were placed in the right orbit on an Atkinson needle without complication. The right eye was then prepped and draped in the usual sterile ophthalmic fashion. A lid speculum was placed. The side-port blade was used to create a paracentesis and the anterior chamber was filled with viscoelastic. The keratome was used to create a near clear corneal incision. The continuous curvilinear capsulorrhexis was performed with a cystotome followed by the capsulorrhexis forceps. Hydrodissection and hydrodelineation were carried out with BSS on a blunt cannula. The lens was removed in a stop and chop technique. The remaining cortical material was removed with the irrigation-aspiration handpiece. The capsular bag was inflated with viscoelastic and the ZCB00 21.0-diopter lens, serial number [id number] was placed in the capsular bag without complication. The remaining viscoelastic was removed from the eye with the irrigation-aspiration handpiece. The wounds were hydrated. The anterior chamber was flushed with Miostat. The eye was inflated to a physiologic pressure and the wounds  were found to be water tight.   Please note that VisionBlue dye was used to stain the anterior capsule as this was an entirely Mckowen mature cataract.   The eye was dressed with Vigamox followed by Maxitrol ointment and a protective shield was placed. The patient will follow-up with me in one day.   ____________________________ Jerilee FieldWilliam L. Maximo Spratling, MD wlp:drc D: 11/13/2011 12:28:37 ET T: 11/13/2011 13:00:14 ET JOB#: [id number]  cc: Sherlynn Tourville L. Robbin Loughmiller, MD, <Dictator> Jerilee FieldWILLIAM L Nila Winker MD ELECTRONICALLY SIGNED 11/14/2011 13:07

## 2015-02-22 IMAGING — CR DG CHEST 2V
1 series · 2 of 2 positions shown · non-contrast
Comparison: None currently available

CLINICAL DATA: Chronic chest pain.

EXAM:
CHEST  2 VIEW

[Series 1: w chest pa · 0.14mm/px · 2 of 2 slices shown]
[im 1/2]
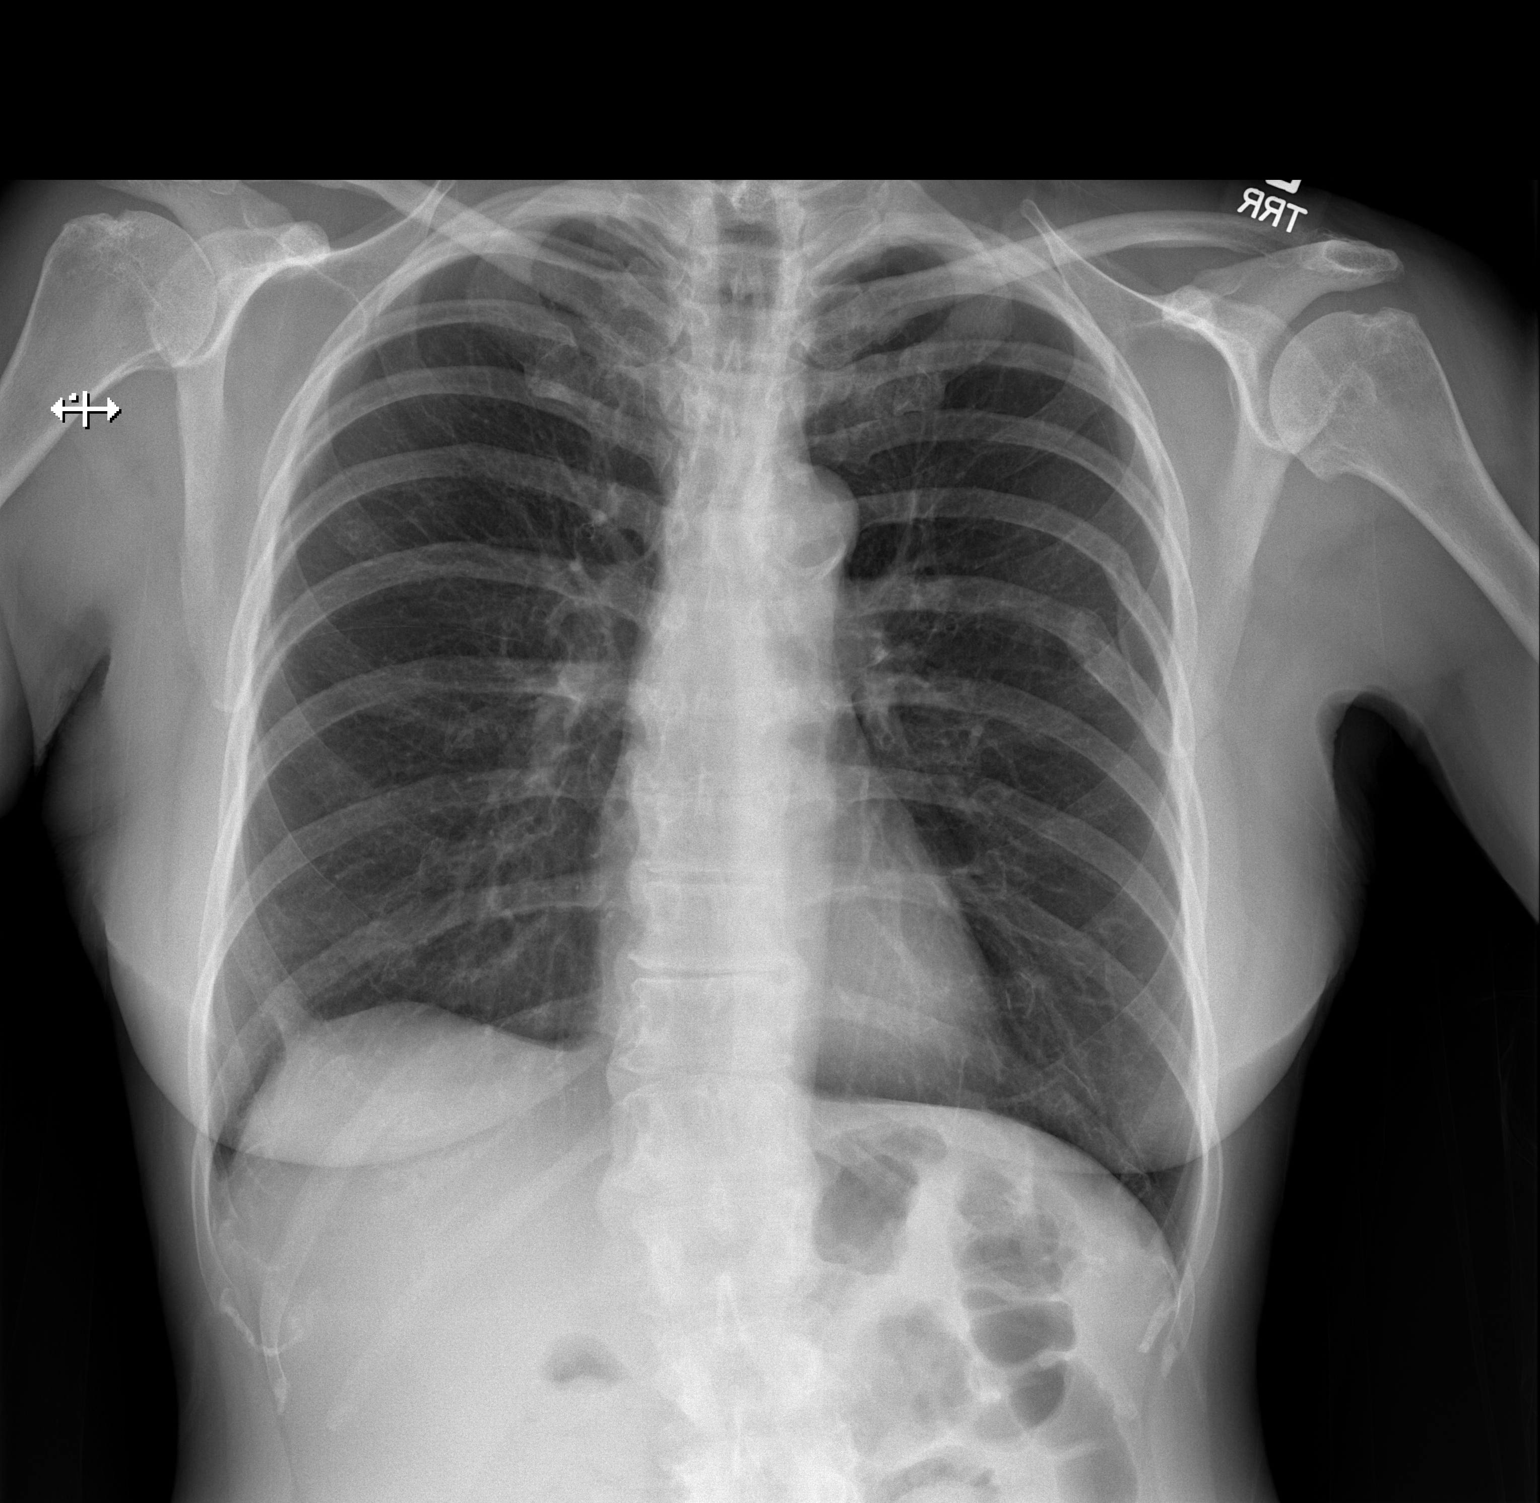
[im 2/2]
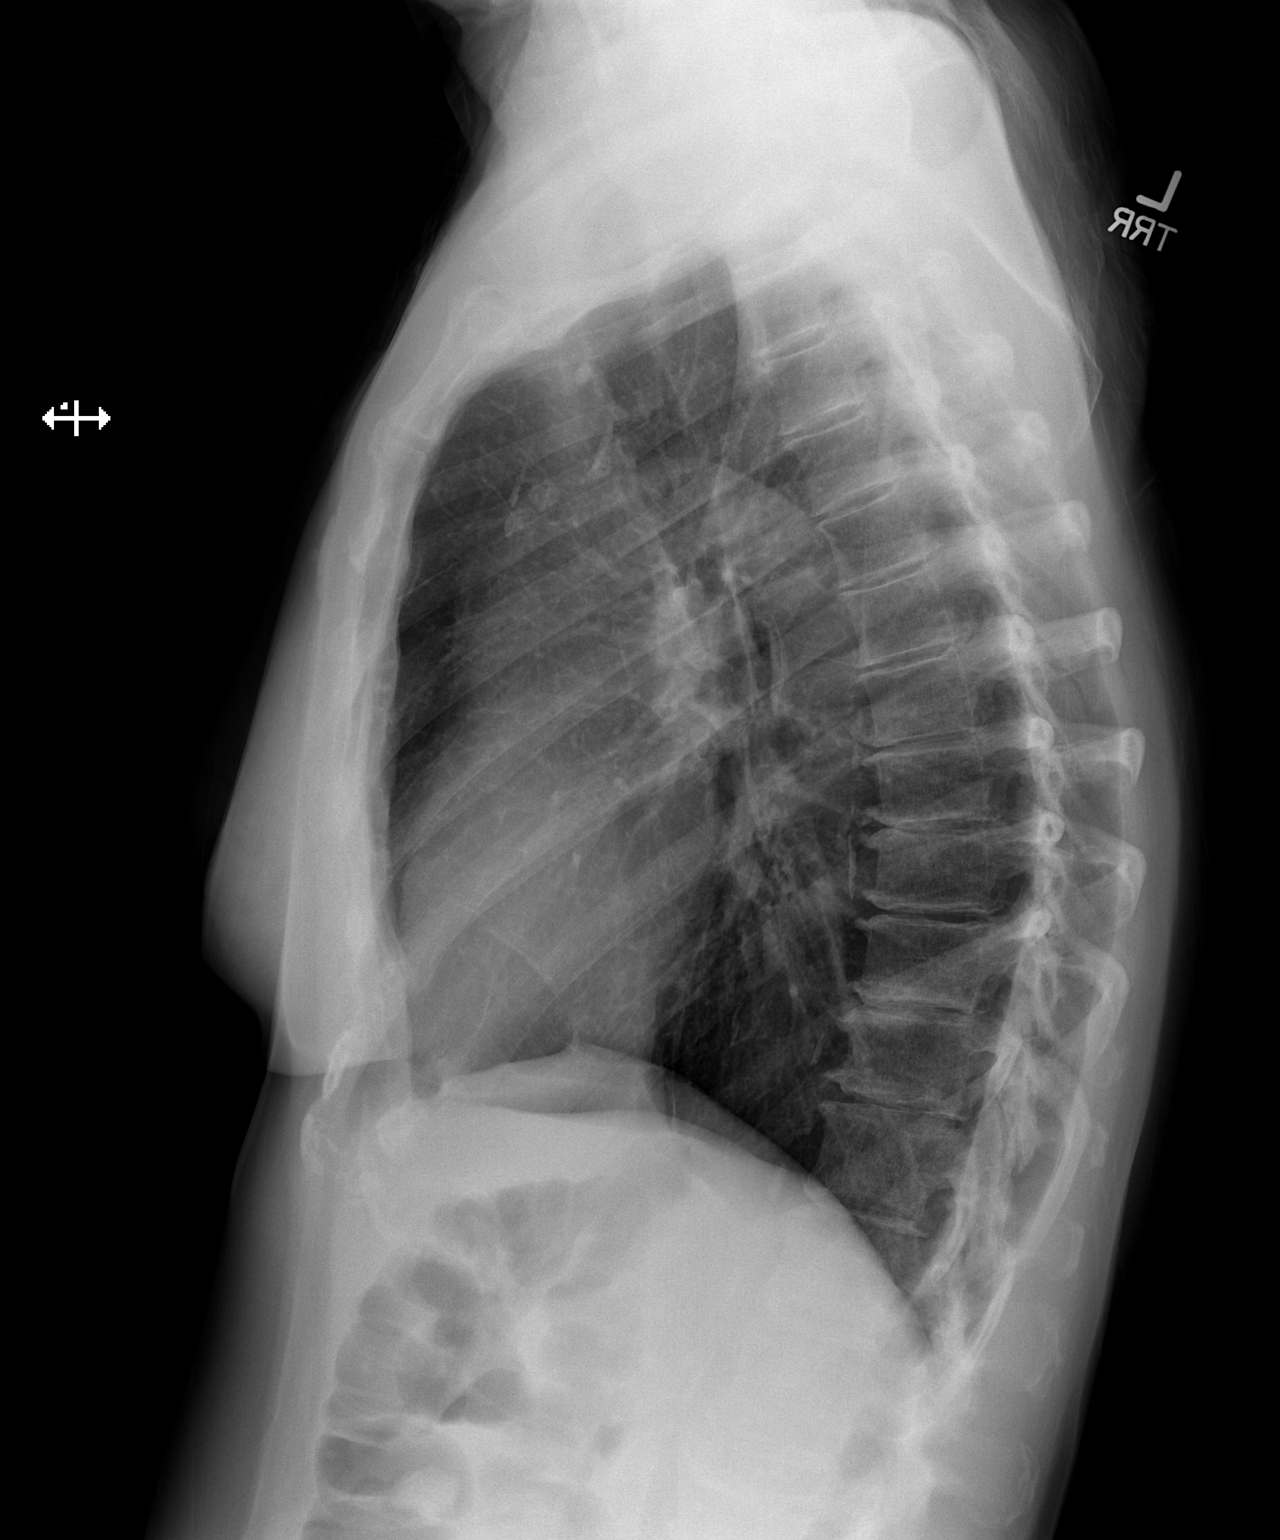

[2 of 2 positions shown; findings below may reference images not displayed]

FINDINGS: Normal heart size and mediastinal contours. No acute infiltrate or
edema. No effusion or pneumothorax. Minimal opacity at the right
base is likely scarring or atelectasis. Remote left sixth and
seventh rib fractures which appear healed and do not explain chest
pain. Mid and lower thoracic degenerative endplate spurring.
IMPRESSION: No active cardiopulmonary disease.

## 2015-06-01 ENCOUNTER — Emergency Department
Admission: EM | Admit: 2015-06-01 | Discharge: 2015-06-01 | Disposition: A | Discharge status: Home / Self Care | Payer: Medicaid Other | Attending: Student | Admitting: Student

## 2015-06-01 ENCOUNTER — Encounter: Payer: Self-pay | Admitting: Medical Oncology

## 2015-06-01 DIAGNOSIS — I10 Essential (primary) hypertension: Secondary | ICD-10-CM | POA: Insufficient documentation

## 2015-06-01 DIAGNOSIS — T7840XA Allergy, unspecified, initial encounter: Secondary | ICD-10-CM | POA: Insufficient documentation

## 2015-06-01 DIAGNOSIS — T43225A Adverse effect of selective serotonin reuptake inhibitors, initial encounter: Secondary | ICD-10-CM | POA: Diagnosis not present

## 2015-06-01 DIAGNOSIS — F17210 Nicotine dependence, cigarettes, uncomplicated: Secondary | ICD-10-CM | POA: Insufficient documentation

## 2015-06-01 DIAGNOSIS — F419 Anxiety disorder, unspecified: Secondary | ICD-10-CM | POA: Diagnosis not present

## 2015-06-01 DIAGNOSIS — T502X5A Adverse effect of carbonic-anhydrase inhibitors, benzothiadiazides and other diuretics, initial encounter: Secondary | ICD-10-CM | POA: Insufficient documentation

## 2015-06-01 DIAGNOSIS — Z79899 Other long term (current) drug therapy: Secondary | ICD-10-CM | POA: Diagnosis not present

## 2015-06-01 DIAGNOSIS — T50905A Adverse effect of unspecified drugs, medicaments and biological substances, initial encounter: Secondary | ICD-10-CM

## 2015-06-01 HISTORY — DX: Essential (primary) hypertension: I10

## 2015-06-01 HISTORY — DX: Dorsalgia, unspecified: M54.9

## 2015-06-01 MED ORDER — DIAZEPAM 5 MG PO TABS: 5.0000 mg | ORAL_TABLET | Freq: Four times a day (QID) | ORAL | Status: AC | PRN

## 2015-06-01 MED ORDER — DIPHENHYDRAMINE HCL 25 MG PO CAPS
25.0000 mg | ORAL_CAPSULE | Freq: Once | ORAL | Status: AC
  Administered 2015-06-01: 25 mg via ORAL
  Filled 2015-06-01: qty 1

## 2015-06-01 MED ORDER — DIAZEPAM 5 MG PO TABS
5.0000 mg | ORAL_TABLET | Freq: Once | ORAL | Status: AC
  Administered 2015-06-01: 5 mg via ORAL
  Filled 2015-06-01: qty 1

## 2015-06-01 NOTE — ED Provider Notes (Signed)
[facility] Emergency Department Provider Note  ____________________________________________  Time seen: Approximately 3:00 PM  I have reviewed the triage vital signs and the nursing notes.   HISTORY  Chief Complaint Allergic Reaction    HPI Michele Burton is a 60 y.o. female with history of chronic back pain, hypertension, arthritis who presents for evaluation of possible allergic reaction to new medications, symptoms began today, have been constant since onset, moderate, no other modifying factors. The patient reports that she was seen by new primary care doctor yesterday. Her Valium was discontinued and she was started on gabapentin, Paxil and triamterene for hypertension and back pain. She reports that since this morning she has felt as if her tongue is swollen, she feels jittery and generally weak. She reports she has taken gabapentin before without issue but has never had Paxil or triamterene. No rash. No chest pain or shortness of breath. No vomiting or diarrhea. No abdominal pain. No history of allergic reaction or known allergies.   Past Medical History  Diagnosis Date  . Arthritis   . Hypertension   . Back pain     There are no active problems to display for this patient.   Past Surgical History  Procedure Laterality Date  . Back surgery  08  . Carpal tunnel release Bilateral 05  . Eye surgery Right 13    cat  . Tonsillectomy    . Dilation and curettage of uterus    . Appendectomy      spontantous abortion,hairy cyct  . Lumbar laminectomy/decompression microdiscectomy Right 10/07/2012    Procedure: Right Lumbar Four-Five Extraforaminal microdiskectomy with C-arm;  Surgeon: Karn Cassis, MD;  Location: [facility];  Service: Neurosurgery;  Laterality: Right;  Right Lumbar Four-Five Extraforaminal microdiskectomy with C-arm    Current Outpatient Rx  Name  Route  Sig  Dispense  Refill  . diazepam (VALIUM) 5 MG tablet   Oral   Take 5 mg  by mouth every 6 (six) hours as needed. For spasms         . diazepam (VALIUM) 5 MG tablet   Oral   Take 1 tablet (5 mg total) by mouth every 6 (six) hours as needed for muscle spasms (back spasms).   10 tablet   0   . HYDROcodone-acetaminophen (NORCO) 10-325 MG per tablet   Oral   Take 1 tablet by mouth every 6 (six) hours as needed for pain.         . Multiple Vitamins-Minerals (MULTIVITAMIN WITH MINERALS) tablet   Oral   Take 1 tablet by mouth daily.           Allergies Review of patient's allergies indicates no known allergies.  No family history on file.  Social History Social History  Substance Use Topics  . Smoking status: Current Every Day Smoker -- 0.40 packs/day for 40 years    Types: Cigarettes  . Smokeless tobacco: Never Used     Comment: occ alcohol  . Alcohol Use: 1.2 oz/week    2 Cans of beer per week    Review of Systems Constitutional: No fever/chills Eyes: No visual changes. ENT: No sore throat. Cardiovascular: Denies chest pain. Respiratory: Denies shortness of breath. Gastrointestinal: No abdominal pain.  No nausea, no vomiting.  No diarrhea.  No constipation. Genitourinary: Negative for dysuria. Musculoskeletal: Negative for back pain. Skin: Negative for rash. Neurological: Negative for headaches, focal weakness or numbness.  10-point ROS otherwise negative.  ____________________________________________   PHYSICAL EXAM:  VITAL SIGNS: ED Triage Vitals  Enc Vitals Group     BP 06/01/15 1440 174/94 mmHg     Pulse Rate 06/01/15 1440 100     Resp 06/01/15 1440 20     Temp 06/01/15 1440 98.3 F (36.8 C)     Temp Source 06/01/15 1440 Oral     SpO2 06/01/15 1440 100 %     Weight 06/01/15 1440 117 lb (53.071 kg)     Height 06/01/15 1440 5\' 2"  (1.575 m)     Head Cir --      Peak Flow --      Pain Score 06/01/15 1441 0     Pain Loc --      Pain Edu? --      Excl. in GC? --     Constitutional: Alert and oriented.  Nontoxic-appearing but she does appear mildly anxious. Eyes: Conjunctivae are normal. PERRL. EOMI. Head: Atraumatic. Nose: No congestion/rhinnorhea. Mouth/Throat: Mucous membranes are moist.  Oropharynx non-erythematous. There is no swelling associated with the tongue, lips or the oropharynx. The oropharynx is clear without edema. Neck: No stridor.  Neck supple without meningismus. Cardiovascular: Normal rate, regular rhythm. Grossly normal heart sounds.  Good peripheral circulation. Respiratory: Normal respiratory effort.  No retractions. Lungs CTAB. Gastrointestinal: Soft and nontender. No distention.  No CVA tenderness. Genitourinary: Deferred Musculoskeletal: No lower extremity tenderness nor edema.  No joint effusions. Neurologic:  Normal speech and language. No gross focal neurologic deficits are appreciated. No gait instability. Skin:  Skin is warm, dry and intact. No rash noted. Psychiatric: Mood is anxious and affect is normal. Speech and behavior are normal.  ____________________________________________   LABS (all labs ordered are listed, but only abnormal results are displayed)  Labs Reviewed - No data to display ____________________________________________  EKG  none ____________________________________________  RADIOLOGY  none ____________________________________________   PROCEDURES  Procedure(s) performed: None  Critical Care performed: No  ____________________________________________   INITIAL IMPRESSION / ASSESSMENT AND PLAN / ED COURSE  Pertinent labs & imaging results that were available during my care of the patient were reviewed by me and considered in my medical decision making (see chart for details).  Michele Burton is a 60 y.o. female with history of chronic back pain, hypertension, arthritis who presents for evaluation of possible allergic reaction to new medications. On exam, she is mildly anxious appearing but otherwise is in no acute  distress. Her vital signs are stable. She does have hypertension with systolic blood pressure of 170s. She has no objective evidence of mouth or tongue swelling. No stridor, no wheeze and no rash, no vomiting. No objective signs to suggest anaphylaxis or severe allergic reaction. I did discuss with her that this could represent an adverse reaction to new medications. Because she previously has had gabapentin without any adverse effects, we discussed that she would continue taking that. We also discussed that she would discontinue Paxil and triamterene until she could follow-up with her primary care doctor so they can discuss alternative management/therapies.  ----------------------------------------- 4:32 PM on 06/01/2015 ----------------------------------------- Patient feels much better after oral Valium, she appears much more calm. We discussed meticulous return precautions and she is comfortable with the discharge plan. DC home. ____________________________________________   FINAL CLINICAL IMPRESSION(S) / ED DIAGNOSES  Final diagnoses:  Adverse effects of medication, initial encounter      Gayla DossEryka A Safiyah Cisney, MD 06/01/15 [phone]

## 2015-06-01 NOTE — ED Notes (Addendum)
Pt reports she started new meds yesterday ( gabapentin, Paxil, and Triamterene) and since then she reports she feels like her tongue is swollen. No swelling noted. Pt was taken off of her valium yesterday and placed on the gabapentin.

## 2016-03-12 DEATH — deceased

## 2016-03-15 ENCOUNTER — Ambulatory Visit: Payer: Medicaid Other | Attending: Family Medicine | Admitting: Physical Therapy
